# Patient Record
Sex: Male | Born: 2005
Health system: Southern US, Community
[De-identification: ages and names within clinical notes are randomized; demographics above are authoritative.]

---
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
## 2024-04-30 ENCOUNTER — Telehealth: Payer: Self-pay

## 2024-04-30 NOTE — Telephone Encounter (Signed)
 Copied from CRM #[id number]. Topic: General - Other >> Apr 30, 2024  2:58 PM Shamecia H wrote: Reason for CRM: Mom called and wanted provider to know that he is gone to basic training and they have taken over his medical care.

## 2024-04-30 NOTE — Telephone Encounter (Signed)
 Just FYI.

## 2024-06-12 ENCOUNTER — Encounter: Payer: Self-pay | Admitting: Sports Medicine
